# Patient Record
Sex: Female | Born: 1960 | Race: White | Hispanic: No | Smoking: Former smoker
Health system: Southern US, Community
[De-identification: ages and names within clinical notes are randomized; demographics above are authoritative.]

## PROBLEM LIST (undated history)

## (undated) DIAGNOSIS — N8110 Cystocele, unspecified: Secondary | ICD-10-CM

## (undated) DIAGNOSIS — Z87410 Personal history of cervical dysplasia: Secondary | ICD-10-CM

## (undated) DIAGNOSIS — Z973 Presence of spectacles and contact lenses: Secondary | ICD-10-CM

---

## 1997-10-01 HISTORY — PX: VAGINAL HYSTERECTOMY: SUR661

## 1998-02-07 ENCOUNTER — Other Ambulatory Visit: Admission: RE | Admit: 1998-02-07 | Discharge: 1998-02-07 | Payer: Self-pay | Admitting: *Deleted

## 1998-03-17 ENCOUNTER — Other Ambulatory Visit: Admission: RE | Admit: 1998-03-17 | Discharge: 1998-03-17 | Payer: Self-pay | Admitting: *Deleted

## 1998-05-30 ENCOUNTER — Inpatient Hospital Stay (HOSPITAL_COMMUNITY): Admission: RE | Admit: 1998-05-30 | Discharge: 1998-06-01 | Payer: Self-pay | Admitting: *Deleted

## 1998-12-06 ENCOUNTER — Other Ambulatory Visit: Admission: RE | Admit: 1998-12-06 | Discharge: 1998-12-06 | Payer: Self-pay | Admitting: *Deleted

## 2000-01-02 ENCOUNTER — Other Ambulatory Visit: Admission: RE | Admit: 2000-01-02 | Discharge: 2000-01-02 | Payer: Self-pay | Admitting: *Deleted

## 2001-01-08 ENCOUNTER — Other Ambulatory Visit: Admission: RE | Admit: 2001-01-08 | Discharge: 2001-01-08 | Payer: Self-pay | Admitting: *Deleted

## 2002-01-15 ENCOUNTER — Other Ambulatory Visit: Admission: RE | Admit: 2002-01-15 | Discharge: 2002-01-15 | Payer: Self-pay | Admitting: *Deleted

## 2003-02-18 ENCOUNTER — Other Ambulatory Visit: Admission: RE | Admit: 2003-02-18 | Discharge: 2003-02-18 | Payer: Self-pay | Admitting: *Deleted

## 2004-04-07 ENCOUNTER — Other Ambulatory Visit: Admission: RE | Admit: 2004-04-07 | Discharge: 2004-04-07 | Payer: Self-pay | Admitting: Obstetrics and Gynecology

## 2005-07-12 ENCOUNTER — Other Ambulatory Visit: Admission: RE | Admit: 2005-07-12 | Discharge: 2005-07-12 | Payer: Self-pay | Admitting: Obstetrics and Gynecology

## 2012-03-27 ENCOUNTER — Other Ambulatory Visit: Payer: Self-pay | Admitting: Family Medicine

## 2012-03-27 DIAGNOSIS — R519 Headache, unspecified: Secondary | ICD-10-CM

## 2012-04-02 ENCOUNTER — Ambulatory Visit
Admission: RE | Admit: 2012-04-02 | Discharge: 2012-04-02 | Disposition: A | Discharge status: Home / Self Care | Payer: 59 | Source: Ambulatory Visit | Attending: Family Medicine | Admitting: Family Medicine

## 2012-04-02 DIAGNOSIS — R519 Headache, unspecified: Secondary | ICD-10-CM

## 2012-04-02 MED ORDER — GADOBENATE DIMEGLUMINE 529 MG/ML IV SOLN
20.0000 mL | Freq: Once | INTRAVENOUS | Status: AC | PRN
  Administered 2012-04-02: 20 mL via INTRAVENOUS

## 2012-04-04 ENCOUNTER — Other Ambulatory Visit: Payer: Self-pay

## 2012-07-29 ENCOUNTER — Encounter (HOSPITAL_COMMUNITY): Payer: Self-pay | Admitting: *Deleted

## 2012-07-29 ENCOUNTER — Emergency Department (HOSPITAL_COMMUNITY)
Admission: EM | Admit: 2012-07-29 | Discharge: 2012-07-30 | Disposition: A | Discharge status: Home / Self Care | Payer: Managed Care, Other (non HMO) | Attending: Emergency Medicine | Admitting: Emergency Medicine

## 2012-07-29 DIAGNOSIS — R0789 Other chest pain: Secondary | ICD-10-CM | POA: Insufficient documentation

## 2012-07-29 LAB — COMPREHENSIVE METABOLIC PANEL
AST: 13 U/L (ref 0–37)
Albumin: 3.9 g/dL (ref 3.5–5.2)
Calcium: 9.5 mg/dL (ref 8.4–10.5)
Creatinine, Ser: 0.7 mg/dL (ref 0.50–1.10)
GFR calc non Af Amer: >90 mL/min (ref >90)
Total Protein: 7.8 g/dL (ref 6.0–8.3)

## 2012-07-29 LAB — CBC WITH DIFFERENTIAL/PLATELET
Basophils Absolute: 0 10*3/uL (ref 0.0–0.1)
Basophils Relative: 0 % (ref 0–1)
Eosinophils Absolute: 0.2 10*3/uL (ref 0.0–0.7)
Eosinophils Relative: 2 % (ref 0–5)
HCT: 36.9 % (ref 36.0–46.0)
MCHC: 32.5 g/dL (ref 30.0–36.0)
MCV: 85.2 fL (ref 78.0–100.0)
Monocytes Absolute: 0.6 10*3/uL (ref 0.1–1.0)
RDW: 13.5 % (ref 11.5–15.5)

## 2012-07-29 LAB — POCT I-STAT TROPONIN I

## 2012-07-29 NOTE — ED Notes (Addendum)
Pt states CP that started Sat. Pt states that it isn't sharp pain but that she does notice it. Pt states that pain starts above left breast and down her left arm. 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 Pt states when she takes a deep breath or when she cough pain is worse

## 2012-07-29 NOTE — ED Notes (Signed)
Pt states she has had chest pain on and off since Saturday. She states the pain radiates into left arm and neck.

## 2012-07-30 ENCOUNTER — Emergency Department (HOSPITAL_COMMUNITY): Payer: Managed Care, Other (non HMO)

## 2012-07-30 LAB — D-DIMER, QUANTITATIVE: D-Dimer, Quant: <0.27 ug/mL-FEU (ref 0.00–0.48)

## 2012-07-30 MED ORDER — NAPROXEN 500 MG PO TABS: 500.0000 mg | ORAL_TABLET | Freq: Two times a day (BID) | ORAL | Status: DC

## 2012-07-30 NOTE — ED Provider Notes (Signed)
History     CSN: [id number]  Arrival date & time 07/29/12  2024   First MD Initiated Contact with Patient 07/29/12 2322      Chief Complaint  Patient presents with  . Chest Pain    (Consider location/radiation/quality/duration/timing/severity/associated sxs/prior treatment) HPI 51 year old female presents to emergency department with complaint of left-sided chest pain. Pain first noticed on Saturday, worse today. Pain worse with moving left arm, and when taking deep breaths. She denies any leg swelling, no estrogens, she is a nonsmoker, no prolonged immobilization. Patient denies history of PE or DVT. She denies any hypertension, diabetes, or history of coronary disease. Mother had pacemaker placed in her 49s, father had mitral valve prolapse. Patient was seen at urgent care and transferred to the emergency department due to abnormal EKG. Patient reports she was lifting small children throughout the day on Saturday. She's never had similar pain. She denies any shortness of breath, diaphoresis, nausea or other associated symptoms  History reviewed. No pertinent past medical history.  History reviewed. No pertinent past surgical history.  History reviewed. No pertinent family history.  History  Substance Use Topics  . Smoking status: Never Smoker   . Smokeless tobacco: Not on file  . Alcohol Use: Yes    OB History    Grav Para Term Preterm Abortions TAB SAB Ect Mult Living                  Review of Systems  All other systems reviewed and are negative.    Allergies  Review of patient's allergies indicates no known allergies.  Home Medications   Current Outpatient Rx  Name Route Sig Dispense Refill  . VITAMIN D PO Oral Take 1 tablet by mouth daily.    . IBUPROFEN 200 MG PO TABS Oral Take 200 mg by mouth every 6 (six) hours as needed. For pain    . PROBIOTIC DAILY PO Oral Take 1 tablet by mouth daily.    . SUDAFED PO Oral Take 1 tablet by mouth every 6 (six) hours as  needed. For allergies    . NAPROXEN 500 MG PO TABS Oral Take 1 tablet (500 mg total) by mouth 2 (two) times daily. 30 tablet 0    BP 123/79  Pulse 74  Temp 98.1 F (36.7 C) (Oral)  Resp 16  SpO2 99%  Physical Exam  Nursing note and vitals reviewed. Constitutional: She is oriented to person, place, and time. She appears well-developed and well-nourished.  HENT:  Head: Normocephalic and atraumatic.  Left Ear: External ear normal.  Nose: Nose normal.  Mouth/Throat: Oropharynx is clear and moist.  Eyes: Conjunctivae normal and EOM are normal. Pupils are equal, round, and reactive to light.  Neck: Normal range of motion. Neck supple. No JVD present. No tracheal deviation present. No thyromegaly present.  Cardiovascular: Normal rate, regular rhythm, normal heart sounds and intact distal pulses.  Exam reveals no gallop and no friction rub.   No murmur heard. Pulmonary/Chest: Effort normal and breath sounds normal. No stridor. No respiratory distress. She has no wheezes. She has no rales. She exhibits tenderness (Patient with tenderness to left upper chest with palpation).  Abdominal: Soft. Bowel sounds are normal. She exhibits no distension and no mass. There is no tenderness. There is no rebound and no guarding.  Musculoskeletal: Normal range of motion. She exhibits no edema and no tenderness.  Lymphadenopathy:    She has no cervical adenopathy.  Neurological: She is alert and oriented to person,  place, and time. She exhibits normal muscle tone. Coordination normal.  Skin: Skin is warm and dry. No rash noted. No erythema. No pallor.  Psychiatric: She has a normal mood and affect. Her behavior is normal. Judgment and thought content normal.    ED Course  Procedures (including critical care time)  Labs Reviewed  COMPREHENSIVE METABOLIC PANEL - Abnormal; Notable for the following:    Total Bilirubin 0.2 (*)     All other components within normal limits  CBC WITH DIFFERENTIAL  POCT  I-STAT TROPONIN I  D-DIMER, QUANTITATIVE  TROPONIN I  LAB REPORT - SCANNED   Dg Chest 2 View  07/30/2012  *RADIOLOGY REPORT*  Clinical Data: Left side chest pain.  CHEST - 2 VIEW  Comparison: None.  Findings: Lungs clear.  Heart size normal.  No pneumothorax or pleural fluid.  IMPRESSION: Negative chest.   Original Report Authenticated By: Bernadene Bell. Maricela Curet, M.D.     Date: 07/29/2012  Rate: 67  Rhythm: normal sinus rhythm  QRS Axis: normal  Intervals: normal  ST/T Wave abnormalities: normal  Conduction Disutrbances:none  Narrative Interpretation:   Old EKG Reviewed: none available    1. Atypical chest pain       MDM  51 year old female with left-sided chest pain. EKG from urgent care shows normal sinus rhythm, no ST elevation. There was question of nonspecific T wave abnormalities with increased T waves. Patient was given nitroglycerin and aspirin. EKG here without any T wave abnormalities, no ST elevation. Lab work unremarkable. I do not feel this is a CNS, PE, pneumothorax, pneumonia, or other serious condition. Suspect musculoskeletal pain. Will treat with NSAIDs and follow up with her primary care Dr.        Olivia Mackie, MD 07/30/12 [id number]

## 2016-02-29 DIAGNOSIS — J029 Acute pharyngitis, unspecified: Secondary | ICD-10-CM | POA: Diagnosis not present

## 2016-03-06 DIAGNOSIS — Z1231 Encounter for screening mammogram for malignant neoplasm of breast: Secondary | ICD-10-CM | POA: Diagnosis not present

## 2016-03-06 DIAGNOSIS — Z01419 Encounter for gynecological examination (general) (routine) without abnormal findings: Secondary | ICD-10-CM | POA: Diagnosis not present

## 2016-03-06 DIAGNOSIS — Z1239 Encounter for other screening for malignant neoplasm of breast: Secondary | ICD-10-CM | POA: Diagnosis not present

## 2016-03-06 DIAGNOSIS — Z6824 Body mass index (BMI) 24.0-24.9, adult: Secondary | ICD-10-CM | POA: Diagnosis not present

## 2016-03-14 DIAGNOSIS — Z13820 Encounter for screening for osteoporosis: Secondary | ICD-10-CM | POA: Diagnosis not present

## 2016-12-03 DIAGNOSIS — M170 Bilateral primary osteoarthritis of knee: Secondary | ICD-10-CM | POA: Diagnosis not present

## 2017-05-09 DIAGNOSIS — Z6824 Body mass index (BMI) 24.0-24.9, adult: Secondary | ICD-10-CM | POA: Diagnosis not present

## 2017-05-09 DIAGNOSIS — Z1231 Encounter for screening mammogram for malignant neoplasm of breast: Secondary | ICD-10-CM | POA: Diagnosis not present

## 2017-05-09 DIAGNOSIS — Z01419 Encounter for gynecological examination (general) (routine) without abnormal findings: Secondary | ICD-10-CM | POA: Diagnosis not present

## 2017-05-21 DIAGNOSIS — Z13220 Encounter for screening for lipoid disorders: Secondary | ICD-10-CM | POA: Diagnosis not present

## 2017-05-21 DIAGNOSIS — Z23 Encounter for immunization: Secondary | ICD-10-CM | POA: Diagnosis not present

## 2017-05-21 DIAGNOSIS — E559 Vitamin D deficiency, unspecified: Secondary | ICD-10-CM | POA: Diagnosis not present

## 2017-05-21 DIAGNOSIS — Z0000 Encounter for general adult medical examination without abnormal findings: Secondary | ICD-10-CM | POA: Diagnosis not present

## 2017-08-26 ENCOUNTER — Other Ambulatory Visit: Payer: Self-pay | Admitting: Obstetrics and Gynecology

## 2017-08-26 DIAGNOSIS — N63 Unspecified lump in unspecified breast: Secondary | ICD-10-CM

## 2017-08-26 DIAGNOSIS — N6459 Other signs and symptoms in breast: Secondary | ICD-10-CM | POA: Diagnosis not present

## 2017-08-26 DIAGNOSIS — N644 Mastodynia: Secondary | ICD-10-CM

## 2017-08-29 ENCOUNTER — Ambulatory Visit
Admission: RE | Admit: 2017-08-29 | Discharge: 2017-08-29 | Disposition: A | Discharge status: Home / Self Care | Payer: Managed Care, Other (non HMO) | Source: Ambulatory Visit | Attending: Obstetrics and Gynecology | Admitting: Obstetrics and Gynecology

## 2017-08-29 ENCOUNTER — Other Ambulatory Visit: Payer: Self-pay | Admitting: Obstetrics and Gynecology

## 2017-08-29 DIAGNOSIS — N63 Unspecified lump in unspecified breast: Secondary | ICD-10-CM

## 2017-08-29 DIAGNOSIS — N6489 Other specified disorders of breast: Secondary | ICD-10-CM | POA: Diagnosis not present

## 2017-08-29 DIAGNOSIS — N644 Mastodynia: Secondary | ICD-10-CM

## 2017-08-29 DIAGNOSIS — R922 Inconclusive mammogram: Secondary | ICD-10-CM | POA: Diagnosis not present

## 2017-11-08 DIAGNOSIS — J069 Acute upper respiratory infection, unspecified: Secondary | ICD-10-CM | POA: Diagnosis not present

## 2017-11-08 DIAGNOSIS — R05 Cough: Secondary | ICD-10-CM | POA: Diagnosis not present

## 2017-11-27 ENCOUNTER — Ambulatory Visit
Admission: RE | Admit: 2017-11-27 | Discharge: 2017-11-27 | Disposition: A | Discharge status: Home / Self Care | Payer: BLUE CROSS/BLUE SHIELD | Source: Ambulatory Visit | Attending: Obstetrics and Gynecology | Admitting: Obstetrics and Gynecology

## 2017-11-27 DIAGNOSIS — N6489 Other specified disorders of breast: Secondary | ICD-10-CM | POA: Diagnosis not present

## 2017-11-27 DIAGNOSIS — N63 Unspecified lump in unspecified breast: Secondary | ICD-10-CM

## 2017-11-27 DIAGNOSIS — N644 Mastodynia: Secondary | ICD-10-CM

## 2018-06-18 DIAGNOSIS — Z6824 Body mass index (BMI) 24.0-24.9, adult: Secondary | ICD-10-CM | POA: Diagnosis not present

## 2018-06-18 DIAGNOSIS — Z01419 Encounter for gynecological examination (general) (routine) without abnormal findings: Secondary | ICD-10-CM | POA: Diagnosis not present

## 2018-06-18 DIAGNOSIS — Z1231 Encounter for screening mammogram for malignant neoplasm of breast: Secondary | ICD-10-CM | POA: Diagnosis not present

## 2018-06-18 DIAGNOSIS — Z13820 Encounter for screening for osteoporosis: Secondary | ICD-10-CM | POA: Diagnosis not present

## 2018-06-25 DIAGNOSIS — Z808 Family history of malignant neoplasm of other organs or systems: Secondary | ICD-10-CM | POA: Diagnosis not present

## 2018-06-25 DIAGNOSIS — Z803 Family history of malignant neoplasm of breast: Secondary | ICD-10-CM | POA: Diagnosis not present

## 2018-06-25 DIAGNOSIS — Z800 Family history of malignant neoplasm of digestive organs: Secondary | ICD-10-CM | POA: Diagnosis not present

## 2018-06-25 DIAGNOSIS — Z801 Family history of malignant neoplasm of trachea, bronchus and lung: Secondary | ICD-10-CM | POA: Diagnosis not present

## 2018-08-05 DIAGNOSIS — Z809 Family history of malignant neoplasm, unspecified: Secondary | ICD-10-CM | POA: Diagnosis not present

## 2018-08-06 ENCOUNTER — Other Ambulatory Visit: Payer: Self-pay | Admitting: Obstetrics and Gynecology

## 2018-08-06 DIAGNOSIS — Z803 Family history of malignant neoplasm of breast: Secondary | ICD-10-CM

## 2019-02-24 DIAGNOSIS — N8110 Cystocele, unspecified: Secondary | ICD-10-CM | POA: Diagnosis not present

## 2019-04-08 NOTE — H&P (Signed)
NAME: Angel Pace, Hirt. MEDICAL RECORD [id number] ACCOUNT [id number] DATE OF BIRTH:1960-11-08 FACILITY: [facility] LOCATION:  PHYSICIAN:Cortana Vanderford Garry Heater, MD  HISTORY AND PHYSICAL  DATE OF ADMISSION:  05/07/2019  CHIEF COMPLAINT:  Symptomatic cystocele and rectocele.  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  More recently, she has noticed a vaginal bulging sensation, has not had any symptoms related to ____  and on exam, a moderate cystocele  and small rectocele with good cuff support was noted.  She presents now for anterior and posterior colporrhaphy.  This procedure including the specific risks regarding bleeding, infection, adjacent organ injury, the expected recovery time, the fact that  pelvic organ prolapse can recur were all reviewed with her, which she understands and accepts.  ALLERGIES:  None.  MEDICATIONS:  Valtrex as needed.  FAMILY HISTORY:  Significant for a father with diabetes.  Mother had breast cancer and osteoporosis.  Her father also had a malignant liver tumor.  SOCIAL HISTORY:  She is widowed.  Denies tobacco or drug use.  Moderate alcohol use.  Her PCP is Dr. Harlan Stains.  Also, of note, she had genetic screening in our office last year that was negative.  PHYSICAL EXAMINATION:   VITAL SIGNS: BP 100/78.  Temperature 98.8, weight is 143. HEENT:  Unremarkable. NECK:  Supple, without masses. LUNGS:  Clear. CARDIOVASCULAR:  Regular rate and rhythm without murmurs, rubs or gallops. BREASTS:  Without masses or tenderness. ABDOMEN:  Soft, flat, nontender. PELVIC:  Vulva unremarkable.  On straining, there is a moderate cystocele and small rectocele.  The cuff support looked reasonably normal.  Bimanual otherwise negative.  IMPRESSION:  Symptomatic cystocele with small rectocele.  PLAN:  Anterior and posterior repair.  Procedure and risks discussed as above.  AN/NUANCE  D:04/08/2019  T:04/08/2019 JOB:[id number]

## 2019-05-04 ENCOUNTER — Encounter (HOSPITAL_BASED_OUTPATIENT_CLINIC_OR_DEPARTMENT_OTHER): Payer: Self-pay | Admitting: *Deleted

## 2019-05-04 ENCOUNTER — Other Ambulatory Visit (HOSPITAL_COMMUNITY)
Admission: RE | Admit: 2019-05-04 | Discharge: 2019-05-04 | Disposition: A | Discharge status: Home / Self Care | Payer: BC Managed Care – PPO | Source: Ambulatory Visit | Attending: Obstetrics and Gynecology | Admitting: Obstetrics and Gynecology

## 2019-05-04 ENCOUNTER — Other Ambulatory Visit: Payer: Self-pay

## 2019-05-04 DIAGNOSIS — N8111 Cystocele, midline: Secondary | ICD-10-CM | POA: Diagnosis not present

## 2019-05-04 DIAGNOSIS — Z01812 Encounter for preprocedural laboratory examination: Secondary | ICD-10-CM | POA: Insufficient documentation

## 2019-05-04 DIAGNOSIS — Z20828 Contact with and (suspected) exposure to other viral communicable diseases: Secondary | ICD-10-CM | POA: Diagnosis not present

## 2019-05-04 DIAGNOSIS — N816 Rectocele: Secondary | ICD-10-CM | POA: Diagnosis not present

## 2019-05-04 LAB — SARS CORONAVIRUS 2 (TAT 6-24 HRS): SARS Coronavirus 2: NEGATIVE

## 2019-05-04 NOTE — Progress Notes (Signed)
Spoke w/ pt via phone for pre-op interview.  Npo after mn.  Arrive at 0530.  Needs t&s.  Pt had covid test done today.  Reviewed [facility] guidelines and visitor resstriction policy.  Pt dose not have any medication to bring.

## 2019-05-06 NOTE — Anesthesia Preprocedure Evaluation (Addendum)
Anesthesia Evaluation  Patient identified by MRN, date of birth, ID band Patient awake    Reviewed: Allergy & Precautions, NPO status , Patient's Chart, lab work & pertinent test results  Airway Mallampati: III  TM Distance: >3 FB Neck ROM: Full    Dental no notable dental hx. (+) Dental Advisory Given, Teeth Intact,    Pulmonary former smoker,    Pulmonary exam normal breath sounds clear to auscultation       Cardiovascular negative cardio ROS Normal cardiovascular exam Rhythm:Regular Rate:Normal     Neuro/Psych negative neurological ROS  negative psych ROS   GI/Hepatic negative GI ROS, Neg liver ROS,   Endo/Other  negative endocrine ROS  Renal/GU negative Renal ROS     Musculoskeletal negative musculoskeletal ROS (+)   Abdominal   Peds  Hematology negative hematology ROS (+)   Anesthesia Other Findings cystocele, rectocele  Reproductive/Obstetrics                          Anesthesia Physical Anesthesia Plan  ASA: II  Anesthesia Plan: General   Post-op Pain Management:    Induction: Intravenous  PONV Risk Score and Plan: 4 or greater and Midazolam, Dexamethasone, Ondansetron and Treatment may vary due to age or medical condition  Airway Management Planned: LMA and Oral ETT  Additional Equipment:   Intra-op Plan:   Post-operative Plan: Extubation in OR  Informed Consent: I have reviewed the patients History and Physical, chart, labs and discussed the procedure including the risks, benefits and alternatives for the proposed anesthesia with the patient or authorized representative who has indicated his/her understanding and acceptance.     Dental advisory given  Plan Discussed with: CRNA and Anesthesiologist  Anesthesia Plan Comments:      Anesthesia Quick Evaluation

## 2019-05-07 ENCOUNTER — Other Ambulatory Visit: Payer: Self-pay

## 2019-05-07 ENCOUNTER — Encounter (HOSPITAL_BASED_OUTPATIENT_CLINIC_OR_DEPARTMENT_OTHER): Payer: Self-pay | Admitting: *Deleted

## 2019-05-07 ENCOUNTER — Observation Stay (HOSPITAL_BASED_OUTPATIENT_CLINIC_OR_DEPARTMENT_OTHER): Payer: BC Managed Care – PPO | Admitting: Anesthesiology

## 2019-05-07 ENCOUNTER — Encounter (HOSPITAL_BASED_OUTPATIENT_CLINIC_OR_DEPARTMENT_OTHER)
Admission: RE | Disposition: A | Discharge status: Home / Self Care | Payer: Self-pay | Source: Home / Self Care | Attending: Obstetrics and Gynecology

## 2019-05-07 ENCOUNTER — Observation Stay (HOSPITAL_BASED_OUTPATIENT_CLINIC_OR_DEPARTMENT_OTHER)
Admission: RE | Admit: 2019-05-07 | Discharge: 2019-05-08 | Disposition: A | Discharge status: Home / Self Care | Payer: BC Managed Care – PPO | Attending: Obstetrics and Gynecology | Admitting: Obstetrics and Gynecology

## 2019-05-07 DIAGNOSIS — Z87891 Personal history of nicotine dependence: Secondary | ICD-10-CM | POA: Diagnosis not present

## 2019-05-07 DIAGNOSIS — N816 Rectocele: Secondary | ICD-10-CM | POA: Diagnosis not present

## 2019-05-07 DIAGNOSIS — N8110 Cystocele, unspecified: Principal | ICD-10-CM | POA: Diagnosis present

## 2019-05-07 HISTORY — DX: Personal history of cervical dysplasia: Z87.410

## 2019-05-07 HISTORY — DX: Cystocele, unspecified: N81.10

## 2019-05-07 HISTORY — PX: ANTERIOR AND POSTERIOR REPAIR: SHX5121

## 2019-05-07 HISTORY — DX: Presence of spectacles and contact lenses: Z97.3

## 2019-05-07 LAB — TYPE AND SCREEN
ABO/RH(D): O POS
Antibody Screen: NEGATIVE

## 2019-05-07 LAB — ABO/RH: ABO/RH(D): O POS

## 2019-05-07 SURGERY — ANTERIOR (CYSTOCELE) AND POSTERIOR REPAIR (RECTOCELE)
Anesthesia: General | Site: Perineum

## 2019-05-07 MED ORDER — ACETAMINOPHEN 500 MG PO TABS
1000.0000 mg | ORAL_TABLET | Freq: Once | ORAL | Status: AC
  Administered 2019-05-07: 1000 mg via ORAL
  Filled 2019-05-07: qty 2

## 2019-05-07 MED ORDER — MORPHINE SULFATE 2 MG/ML IV SOLN
INTRAVENOUS | Status: DC
  Filled 2019-05-07: qty 30

## 2019-05-07 MED ORDER — MIDAZOLAM HCL 2 MG/2ML IJ SOLN
INTRAMUSCULAR | Status: AC
  Filled 2019-05-07: qty 2

## 2019-05-07 MED ORDER — PROMETHAZINE HCL 25 MG/ML IJ SOLN
6.2500 mg | INTRAMUSCULAR | Status: DC | PRN
  Filled 2019-05-07: qty 1

## 2019-05-07 MED ORDER — LIDOCAINE-EPINEPHRINE 1 %-1:100000 IJ SOLN
INTRAMUSCULAR | Status: DC | PRN
  Administered 2019-05-07: 20 mL

## 2019-05-07 MED ORDER — GABAPENTIN 300 MG PO CAPS
ORAL_CAPSULE | ORAL | Status: AC
  Filled 2019-05-07: qty 1

## 2019-05-07 MED ORDER — DIPHENHYDRAMINE HCL 50 MG/ML IJ SOLN
12.5000 mg | Freq: Four times a day (QID) | INTRAMUSCULAR | Status: DC | PRN
  Filled 2019-05-07: qty 0.25

## 2019-05-07 MED ORDER — PROPOFOL 10 MG/ML IV BOLUS
INTRAVENOUS | Status: DC | PRN
  Administered 2019-05-07: 120 mg via INTRAVENOUS

## 2019-05-07 MED ORDER — DEXTROSE IN LACTATED RINGERS 5 % IV SOLN
INTRAVENOUS | Status: DC
  Filled 2019-05-07: qty 1000

## 2019-05-07 MED ORDER — OXYCODONE-ACETAMINOPHEN 5-325 MG PO TABS
ORAL_TABLET | ORAL | Status: AC
  Filled 2019-05-07: qty 1

## 2019-05-07 MED ORDER — SODIUM CHLORIDE 0.9 % IV SOLN
2.0000 g | INTRAVENOUS | Status: AC
  Administered 2019-05-07 (×2): 2 g via INTRAVENOUS
  Filled 2019-05-07: qty 2

## 2019-05-07 MED ORDER — PROPOFOL 10 MG/ML IV BOLUS
INTRAVENOUS | Status: AC
  Filled 2019-05-07: qty 40

## 2019-05-07 MED ORDER — KETOROLAC TROMETHAMINE 30 MG/ML IJ SOLN
INTRAMUSCULAR | Status: AC
  Filled 2019-05-07: qty 1

## 2019-05-07 MED ORDER — SODIUM CHLORIDE 0.9 % IV SOLN
INTRAVENOUS | Status: AC
  Filled 2019-05-07: qty 2

## 2019-05-07 MED ORDER — GABAPENTIN 300 MG PO CAPS
300.0000 mg | ORAL_CAPSULE | Freq: Two times a day (BID) | ORAL | Status: DC
  Administered 2019-05-07 (×2): 300 mg via ORAL
  Filled 2019-05-07: qty 1

## 2019-05-07 MED ORDER — KETOROLAC TROMETHAMINE 30 MG/ML IJ SOLN
30.0000 mg | Freq: Four times a day (QID) | INTRAMUSCULAR | Status: DC
  Administered 2019-05-07 – 2019-05-08 (×3): 30 mg via INTRAVENOUS
  Filled 2019-05-07: qty 1

## 2019-05-07 MED ORDER — HYDROMORPHONE HCL 1 MG/ML IJ SOLN
0.2500 mg | INTRAMUSCULAR | Status: DC | PRN
  Filled 2019-05-07: qty 0.5

## 2019-05-07 MED ORDER — OXYCODONE-ACETAMINOPHEN 5-325 MG PO TABS
1.0000 | ORAL_TABLET | ORAL | Status: DC | PRN
  Administered 2019-05-07: 1 via ORAL
  Filled 2019-05-07: qty 2

## 2019-05-07 MED ORDER — FENTANYL CITRATE (PF) 100 MCG/2ML IJ SOLN
INTRAMUSCULAR | Status: AC
  Filled 2019-05-07: qty 4

## 2019-05-07 MED ORDER — EPHEDRINE 5 MG/ML INJ
INTRAVENOUS | Status: AC
  Filled 2019-05-07: qty 10

## 2019-05-07 MED ORDER — OXYCODONE HCL 5 MG PO TABS
5.0000 mg | ORAL_TABLET | Freq: Once | ORAL | Status: DC | PRN
  Filled 2019-05-07: qty 1

## 2019-05-07 MED ORDER — ONDANSETRON HCL 4 MG/2ML IJ SOLN
4.0000 mg | Freq: Four times a day (QID) | INTRAMUSCULAR | Status: DC | PRN
  Filled 2019-05-07: qty 2

## 2019-05-07 MED ORDER — ARTIFICIAL TEARS OPHTHALMIC OINT
TOPICAL_OINTMENT | OPHTHALMIC | Status: AC
  Filled 2019-05-07: qty 3.5

## 2019-05-07 MED ORDER — FAMOTIDINE 20 MG PO TABS
20.0000 mg | ORAL_TABLET | Freq: Once | ORAL | Status: AC
  Administered 2019-05-07: 06:00:00 20 mg via ORAL
  Filled 2019-05-07: qty 1

## 2019-05-07 MED ORDER — LACTATED RINGERS IV SOLN
INTRAVENOUS | Status: DC
  Administered 2019-05-07 (×2): via INTRAVENOUS
  Filled 2019-05-07 (×2): qty 1000

## 2019-05-07 MED ORDER — IBUPROFEN 800 MG PO TABS
800.0000 mg | ORAL_TABLET | Freq: Four times a day (QID) | ORAL | Status: DC
  Filled 2019-05-07: qty 1

## 2019-05-07 MED ORDER — ONDANSETRON HCL 4 MG/2ML IJ SOLN
INTRAMUSCULAR | Status: AC
  Filled 2019-05-07: qty 2

## 2019-05-07 MED ORDER — FENTANYL CITRATE (PF) 100 MCG/2ML IJ SOLN
INTRAMUSCULAR | Status: DC | PRN
  Administered 2019-05-07: 25 ug via INTRAVENOUS

## 2019-05-07 MED ORDER — DEXAMETHASONE SODIUM PHOSPHATE 10 MG/ML IJ SOLN
INTRAMUSCULAR | Status: DC | PRN
  Administered 2019-05-07: 5 mg via INTRAVENOUS

## 2019-05-07 MED ORDER — ESTRADIOL 0.1 MG/GM VA CREA
TOPICAL_CREAM | VAGINAL | Status: AC
  Filled 2019-05-07: qty 42.5

## 2019-05-07 MED ORDER — LIDOCAINE 2% (20 MG/ML) 5 ML SYRINGE
INTRAMUSCULAR | Status: AC
  Filled 2019-05-07: qty 5

## 2019-05-07 MED ORDER — OXYCODONE HCL 5 MG/5ML PO SOLN
5.0000 mg | Freq: Once | ORAL | Status: DC | PRN
  Filled 2019-05-07: qty 5

## 2019-05-07 MED ORDER — DIPHENHYDRAMINE HCL 12.5 MG/5ML PO ELIX
12.5000 mg | ORAL_SOLUTION | Freq: Four times a day (QID) | ORAL | Status: DC | PRN
  Filled 2019-05-07: qty 5

## 2019-05-07 MED ORDER — NALOXONE HCL 0.4 MG/ML IJ SOLN
0.4000 mg | INTRAMUSCULAR | Status: DC | PRN
  Filled 2019-05-07: qty 1

## 2019-05-07 MED ORDER — LIDOCAINE 2% (20 MG/ML) 5 ML SYRINGE
INTRAMUSCULAR | Status: DC | PRN
  Administered 2019-05-07: 60 mg via INTRAVENOUS

## 2019-05-07 MED ORDER — MENTHOL 3 MG MT LOZG
1.0000 | LOZENGE | OROMUCOSAL | Status: DC | PRN
  Filled 2019-05-07: qty 9

## 2019-05-07 MED ORDER — ONDANSETRON HCL 4 MG PO TABS
4.0000 mg | ORAL_TABLET | Freq: Four times a day (QID) | ORAL | Status: DC | PRN
  Filled 2019-05-07: qty 1

## 2019-05-07 MED ORDER — ONDANSETRON HCL 4 MG/2ML IJ SOLN
INTRAMUSCULAR | Status: DC | PRN
  Administered 2019-05-07: 4 mg via INTRAVENOUS

## 2019-05-07 MED ORDER — MIDAZOLAM HCL 2 MG/2ML IJ SOLN
INTRAMUSCULAR | Status: DC | PRN
  Administered 2019-05-07: 2 mg via INTRAVENOUS

## 2019-05-07 MED ORDER — FAMOTIDINE 20 MG PO TABS
ORAL_TABLET | ORAL | Status: AC
  Filled 2019-05-07: qty 1

## 2019-05-07 MED ORDER — SODIUM CHLORIDE 0.9% FLUSH
9.0000 mL | INTRAVENOUS | Status: DC | PRN
  Filled 2019-05-07: qty 10

## 2019-05-07 MED ORDER — ACETAMINOPHEN 500 MG PO TABS
ORAL_TABLET | ORAL | Status: AC
  Filled 2019-05-07: qty 2

## 2019-05-07 MED ORDER — ESTRADIOL 0.1 MG/GM VA CREA
TOPICAL_CREAM | VAGINAL | Status: DC | PRN
  Administered 2019-05-07: 1 via VAGINAL

## 2019-05-07 MED ORDER — KETOROLAC TROMETHAMINE 30 MG/ML IJ SOLN
30.0000 mg | Freq: Once | INTRAMUSCULAR | Status: AC | PRN
  Filled 2019-05-07: qty 1

## 2019-05-07 MED ORDER — SODIUM CHLORIDE (PF) 0.9 % IJ SOLN
INTRAMUSCULAR | Status: DC | PRN
  Administered 2019-05-07: 20 mL

## 2019-05-07 MED ORDER — KETOROLAC TROMETHAMINE 30 MG/ML IJ SOLN
INTRAMUSCULAR | Status: DC | PRN
  Administered 2019-05-07: 30 mg via INTRAVENOUS

## 2019-05-07 MED ORDER — KETOROLAC TROMETHAMINE 30 MG/ML IJ SOLN
30.0000 mg | Freq: Once | INTRAMUSCULAR | Status: DC
  Filled 2019-05-07: qty 1

## 2019-05-07 MED ORDER — INDIGOTINDISULFONATE SODIUM 8 MG/ML IJ SOLN
INTRAMUSCULAR | Status: AC
  Filled 2019-05-07: qty 5

## 2019-05-07 MED ORDER — EPHEDRINE SULFATE 50 MG/ML IJ SOLN
INTRAMUSCULAR | Status: DC | PRN
  Administered 2019-05-07: 10 mg via INTRAVENOUS

## 2019-05-07 SURGICAL SUPPLY — 29 items
CANISTER SUCT 3000ML PPV (MISCELLANEOUS) ×2 IMPLANT
CATH ROBINSON RED A/P 14FR (CATHETERS) ×2 IMPLANT
COVER WAND RF STERILE (DRAPES) ×2 IMPLANT
DECANTER SPIKE VIAL GLASS SM (MISCELLANEOUS) ×2 IMPLANT
GAUZE PACKING 1 X5 YD ST (GAUZE/BANDAGES/DRESSINGS) ×2 IMPLANT
GLOVE BIO SURGEON STRL SZ 6.5 (GLOVE) ×2 IMPLANT
GLOVE BIO SURGEON STRL SZ7 (GLOVE) ×2 IMPLANT
GLOVE BIOGEL PI IND STRL 7.0 (GLOVE) ×1 IMPLANT
GLOVE BIOGEL PI INDICATOR 7.0 (GLOVE) ×1
GOWN STRL REUS W/TWL LRG LVL3 (GOWN DISPOSABLE) ×8 IMPLANT
HOLDER FOLEY CATH W/STRAP (MISCELLANEOUS) ×2 IMPLANT
NEEDLE HYPO 22GX1.5 SAFETY (NEEDLE) ×2 IMPLANT
NEEDLE SPNL 22GX3.5 QUINCKE BK (NEEDLE) ×2 IMPLANT
NS IRRIG 500ML POUR BTL (IV SOLUTION) ×2 IMPLANT
PACK VAGINAL WOMENS (CUSTOM PROCEDURE TRAY) ×2 IMPLANT
SET IRRIG Y TYPE TUR BLADDER L (SET/KITS/TRAYS/PACK) IMPLANT
SUT CHROMIC 2 0 CT 1 (SUTURE) IMPLANT
SUT SILK 2 0 FSL 18 (SUTURE) IMPLANT
SUT VIC AB 2-0 CT1 (SUTURE) ×4 IMPLANT
SUT VIC AB 2-0 CT1 27 (SUTURE)
SUT VIC AB 2-0 CT1 TAPERPNT 27 (SUTURE) IMPLANT
SUT VIC AB 2-0 SH 27 (SUTURE) ×5
SUT VIC AB 2-0 SH 27XBRD (SUTURE) ×5 IMPLANT
SUT VIC AB 2-0 UR6 27 (SUTURE) ×4 IMPLANT
SUT VICRYL RAPIDE 3 0 (SUTURE) ×6 IMPLANT
SUT VICRYL RAPIDE 3-0 36IN (SUTURE) IMPLANT
TOWEL OR 17X26 10 PK STRL BLUE (TOWEL DISPOSABLE) ×2 IMPLANT
TRAY FOLEY W/BAG SLVR 14FR (SET/KITS/TRAYS/PACK) ×2 IMPLANT
WATER STERILE IRR 500ML POUR (IV SOLUTION) IMPLANT

## 2019-05-07 NOTE — Op Note (Signed)
Preoperative diagnosis: Symptomatic cystocele and rectocele  Postoperative diagnosis: Same  Procedure: Anterior and posterior colporrhaphy  Surgeon: Matthew Saras  Assistant: [place]  EBL: 20 cc  Procedure and findings:  The patient was taken to the operating room after an adequate level of general anesthesia was obtained with the legs in stirrups the perineum and vagina were prepped and draped in the bladder was drained appropriate timeouts taken at that point.  EUA revealed moderate cystocele small rectocele bimanual otherwise negative her cuff support was reasonably normal.  Starting with the anterior repair, diluted Xylocaine was injected into the midline of the anterior vaginal wall, starting at the cuff the midline incision was made up to the UV angle Allis clamps were then used to place the vaginal mucosa on traction and the underlying perivesical tissue was dissected with sharp and blunt dissection reducing the cystocele.  When this was completed, 2-0 Vicryl sutures were then used to plicate the perivesical fascia in the midline for support.  Excess vaginal mucosa was trimmed and then approximated with 2-0 Vicryl interrupted sutures.  On the posterior side, a small triangle of perineal skin was excised, the posterior vaginal mucosa was incised in the midline approximately two thirds of the way up, the underlying perirectal tissue and rectocele were reduced, once this was completed 2-0 Vicryl sutures were then used to reapproximate the perirectal fascia in the midline.  Very small amount of excess posterior mucosa was excised and then reapproximated with 2-0 Vicryl interrupted sutures.  3-0 Vicryl repeat sutures were used for the deeper perineal tissue and perineal skin.  Foley catheter then positioned draining clear urine.  Vagina was packed with Estrace pack she tolerated this well went to recovery room in good condition.  Dictated with Dragon Medical 1  Margarette Asal MD

## 2019-05-07 NOTE — Anesthesia Procedure Notes (Addendum)
Procedure Name: LMA Insertion Performed by: Wanita Chamberlain, CRNA Patient Re-evaluated:Patient Re-evaluated prior to induction Oxygen Delivery Method: Circle system utilized Preoxygenation: Pre-oxygenation with 100% oxygen Induction Type: IV induction Ventilation: Mask ventilation without difficulty LMA: LMA inserted LMA Size: 4.0 Number of attempts: 1 Placement Confirmation: breath sounds checked- equal and bilateral,  CO2 detector and positive ETCO2 Tube secured with: Tape Dental Injury: Teeth and Oropharynx as per pre-operative assessment

## 2019-05-07 NOTE — Progress Notes (Signed)
Vaginal packing removed, pt tolerated well.

## 2019-05-07 NOTE — Transfer of Care (Signed)
Immediate Anesthesia Transfer of Care Note  Patient: Angel Pace  Procedure(s) Performed: ANTERIOR (CYSTOCELE) AND POSTERIOR REPAIR (RECTOCELE) (N/A Perineum)  Patient Location: PACU  Anesthesia Type:General  Level of Consciousness: awake, alert , oriented and patient cooperative  Airway & Oxygen Therapy: Patient Spontanous Breathing and Patient connected to nasal cannula oxygen  Post-op Assessment: Report given to RN and Post -op Vital signs reviewed and stable  Post vital signs: Reviewed and stable  Last Vitals:  Vitals Value Taken Time  BP    Temp    Pulse    Resp    SpO2      Last Pain:  Vitals:   05/07/19 0632  TempSrc:   PainSc: 0-No pain      Patients Stated Pain Goal: 7 (13/08/65 7846)  Complications: No apparent anesthesia complications

## 2019-05-07 NOTE — Anesthesia Postprocedure Evaluation (Signed)
Anesthesia Post Note  Patient: Angel Pace  Procedure(s) Performed: ANTERIOR (CYSTOCELE) AND POSTERIOR REPAIR (RECTOCELE) (N/A Perineum)     Patient location during evaluation: PACU Anesthesia Type: General Level of consciousness: awake and alert Pain management: pain level controlled Vital Signs Assessment: post-procedure vital signs reviewed and stable Respiratory status: spontaneous breathing, nonlabored ventilation, respiratory function stable and patient connected to nasal cannula oxygen Cardiovascular status: blood pressure returned to baseline and stable Postop Assessment: no apparent nausea or vomiting Anesthetic complications: no    Last Vitals:  Vitals:   05/07/19 1022 05/07/19 1130  BP: (!) 145/86 106/66  Pulse: 64 65  Resp: 16 16  Temp: 36.8 C 36.5 C  SpO2: 100% 98%    Last Pain:  Vitals:   05/07/19 1130  TempSrc:   PainSc: Asleep                 Ryan P Ellender

## 2019-05-07 NOTE — Progress Notes (Signed)
The patient was re-examined with no change in status 

## 2019-05-08 ENCOUNTER — Encounter (HOSPITAL_BASED_OUTPATIENT_CLINIC_OR_DEPARTMENT_OTHER): Payer: Self-pay | Admitting: Obstetrics and Gynecology

## 2019-05-08 DIAGNOSIS — N8110 Cystocele, unspecified: Secondary | ICD-10-CM | POA: Diagnosis not present

## 2019-05-08 DIAGNOSIS — Z87891 Personal history of nicotine dependence: Secondary | ICD-10-CM | POA: Diagnosis not present

## 2019-05-08 DIAGNOSIS — N816 Rectocele: Secondary | ICD-10-CM | POA: Diagnosis not present

## 2019-05-08 LAB — CBC
HCT: 31.4 % — ABNORMAL LOW (ref 36.0–46.0)
Hemoglobin: 10.3 g/dL — ABNORMAL LOW (ref 12.0–15.0)
MCH: 30 pg (ref 26.0–34.0)
MCHC: 32.8 g/dL (ref 30.0–36.0)
MCV: 91.5 fL (ref 80.0–100.0)
Platelets: 257 10*3/uL (ref 150–400)
RBC: 3.43 MIL/uL — ABNORMAL LOW (ref 3.87–5.11)
RDW: 13.2 % (ref 11.5–15.5)
WBC: 11.9 10*3/uL — ABNORMAL HIGH (ref 4.0–10.5)
nRBC: 0 % (ref 0.0–0.2)

## 2019-05-08 MED ORDER — OXYCODONE-ACETAMINOPHEN 5-325 MG PO TABS: 1.0000 | ORAL_TABLET | ORAL | 0 refills | Status: AC | PRN

## 2019-05-08 MED ORDER — IBUPROFEN 800 MG PO TABS: 800.0000 mg | ORAL_TABLET | Freq: Three times a day (TID) | ORAL | 1 refills | Status: AC | PRN

## 2019-05-08 MED ORDER — KETOROLAC TROMETHAMINE 30 MG/ML IJ SOLN
INTRAMUSCULAR | Status: AC
  Filled 2019-05-08: qty 1

## 2019-05-08 NOTE — Discharge Instructions (Signed)
Warm H2O sitz bath as needed BID for comfort, can follow with Dermoplast spray as needed Colace daily Miralax as needed

## 2019-05-08 NOTE — Discharge Summary (Signed)
Physician Discharge Summary  Patient ID: Angel Pace MRN: [id number] DOB/AGE: 11-29-1960 58 y.o.  Admit date: 05/07/2019 Discharge date: 05/08/2019  Admission Diagnoses:Cystocele/rectocele  Discharge Diagnoses: same Active Problems:   Cystocele with rectocele   Discharged Condition: good  Hospital Course: adm for A and P repair, on POD 1>>vag pack removed and cath out, able to void w/o prob, afeb and tol PO  Consults: None  Significant Diagnostic Studies: labs:  Results for orders placed or performed during the hospital encounter of 05/07/19 (from the past 24 hour(s))  CBC     Status: Abnormal   Collection Time: 05/08/19  5:05 AM  Result Value Ref Range   WBC 11.9 (H) 4.0 - 10.5 K/uL   RBC 3.43 (L) 3.87 - 5.11 MIL/uL   Hemoglobin 10.3 (L) 12.0 - 15.0 g/dL   HCT 31.4 (L) 36.0 - 46.0 %   MCV 91.5 80.0 - 100.0 fL   MCH 30.0 26.0 - 34.0 pg   MCHC 32.8 30.0 - 36.0 g/dL   RDW 13.2 11.5 - 15.5 %   Platelets 257 150 - 400 K/uL   nRBC 0.0 0.0 - 0.2 %    Treatments: surgery: A and P repair  Discharge Exam: Blood pressure (!) 109/56, pulse 78, temperature 98.3 F (36.8 C), resp. rate 18, height 5\' 6"  (1.676 m), weight 64.9 kg, SpO2 98 %. General appearance: alert abd soft, flat + BS  Disposition: Discharge disposition: 01-Home or Self Care        Allergies as of 05/08/2019   No Known Allergies     Medication List    TAKE these medications   ibuprofen 800 MG tablet Commonly known as: ADVIL Take 1 tablet (800 mg total) by mouth every 8 (eight) hours as needed.   oxyCODONE-acetaminophen 5-325 MG tablet Commonly known as: PERCOCET/ROXICET Take 1 tablet by mouth every 4 (four) hours as needed for moderate pain.   valACYclovir 500 MG tablet Commonly known as: VALTREX Take 500 mg by mouth as needed.      Follow-up Information    Molli Posey, MD. Schedule an appointment as soon as possible for a visit in 1 week(s).   Specialty: Obstetrics and  Gynecology Contact information: [address] [place] [address] [phone]           Signed: Margarette Asal 05/08/2019, 7:06 AM

## 2019-07-01 DIAGNOSIS — Z6823 Body mass index (BMI) 23.0-23.9, adult: Secondary | ICD-10-CM | POA: Diagnosis not present

## 2019-07-01 DIAGNOSIS — Z01419 Encounter for gynecological examination (general) (routine) without abnormal findings: Secondary | ICD-10-CM | POA: Diagnosis not present

## 2019-07-11 IMAGING — MG 2D DIGITAL DIAGNOSTIC UNILATERAL LEFT MAMMOGRAM WITH CAD AND ADJ
8 of 12 series · 8 of 28 positions shown · non-contrast
Comparison: 05/09/2017 and earlier

CLINICAL DATA: Palpable abnormality in the left breast. Patient
first noted mass 1 week ago, and mass has gotten smaller since first
found.

EXAM:
2D DIGITAL DIAGNOSTIC LEFT MAMMOGRAM WITH CAD AND ADJUNCT TOMO
ULTRASOUND LEFT BREAST

[L XCCL synth-2D]
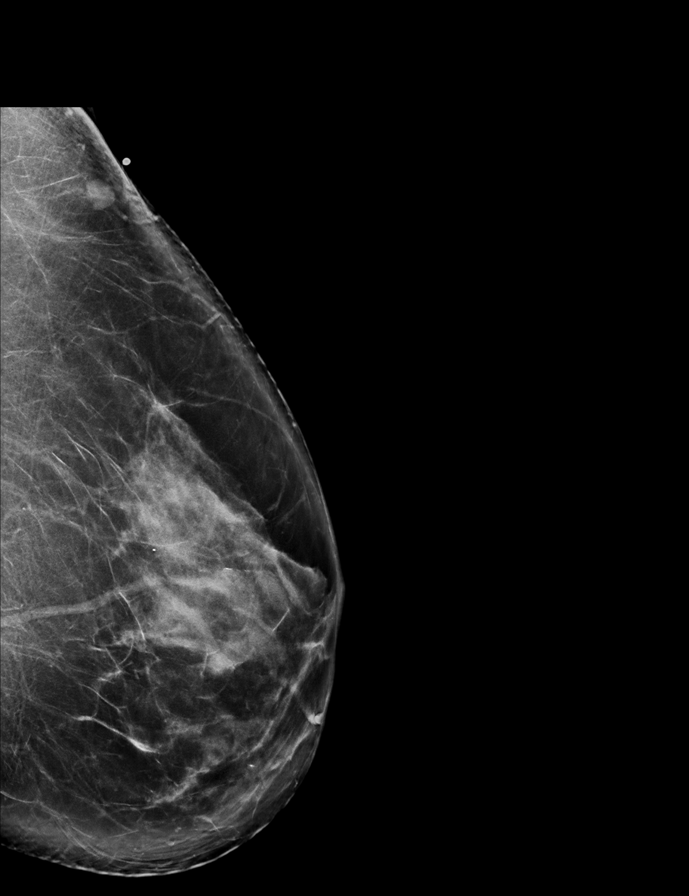

[L CC]
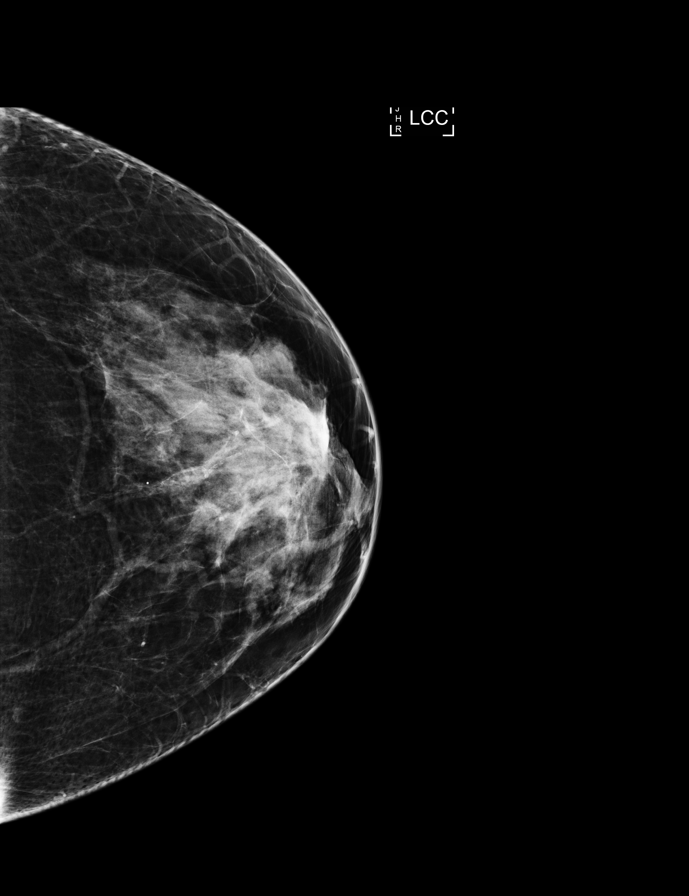

[L CC synth-2D]
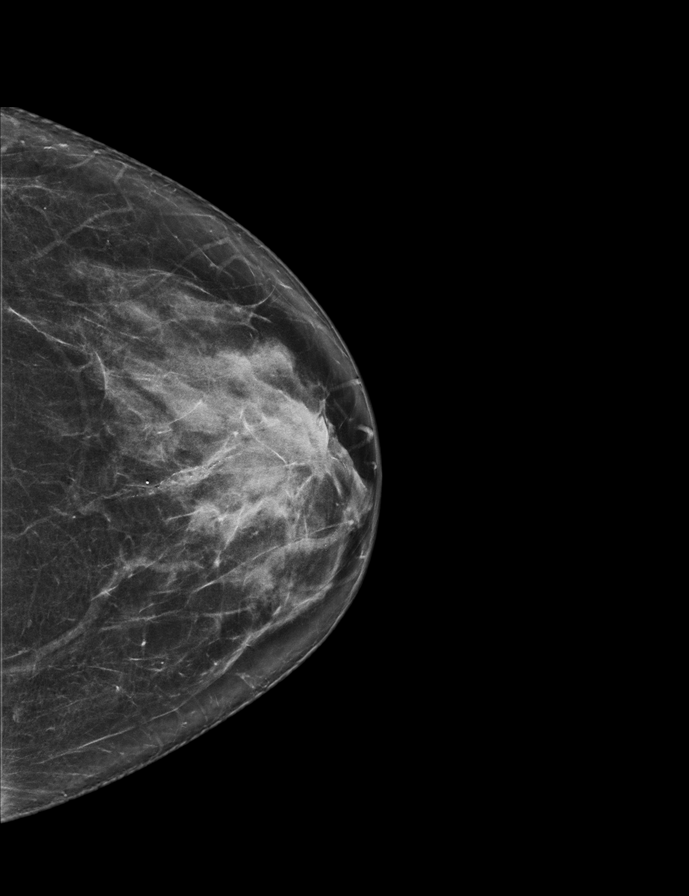

[L TAN synth-2D]
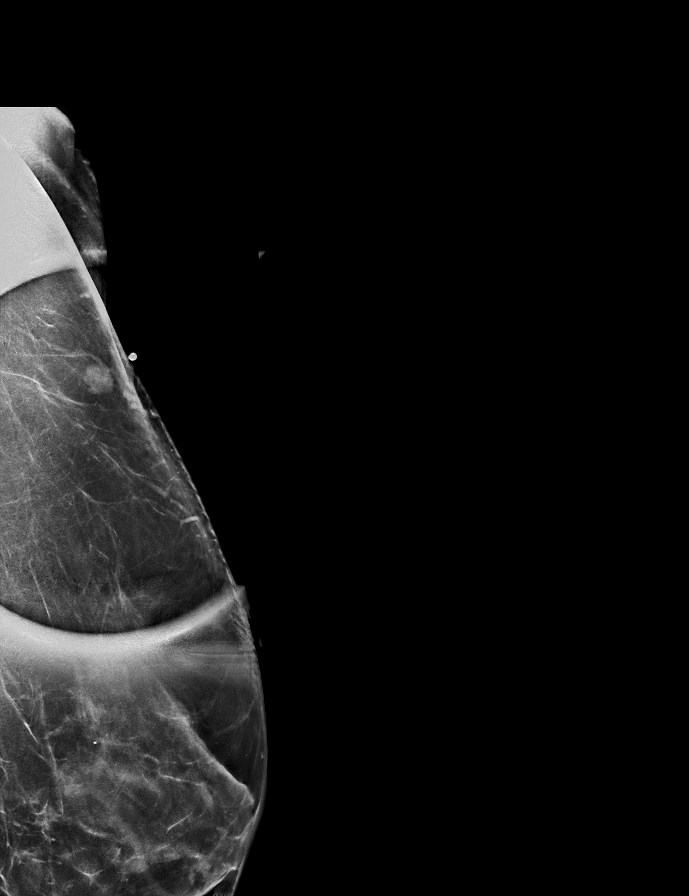

[L TAN]
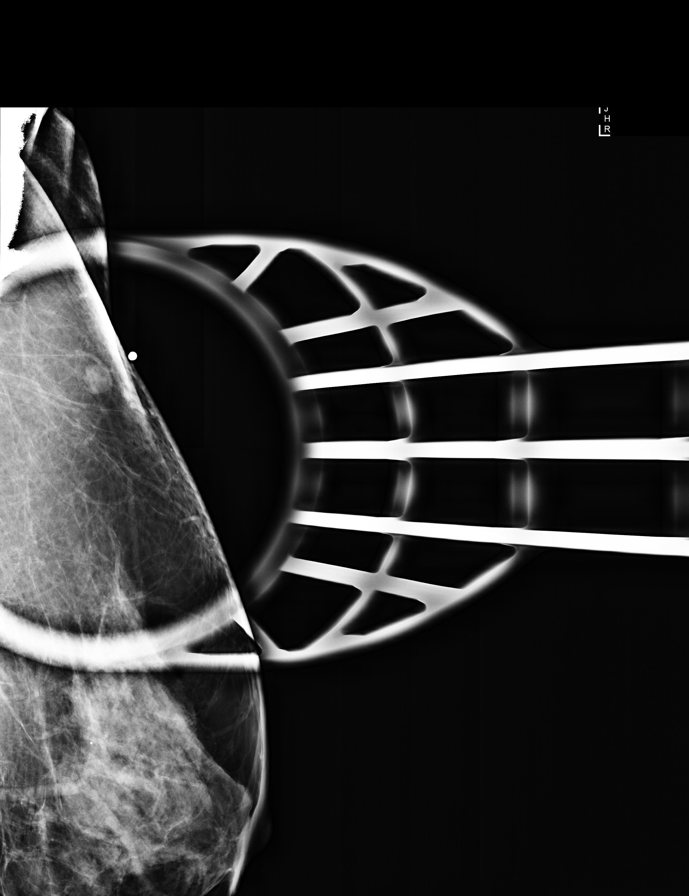

[L MLO synth-2D]
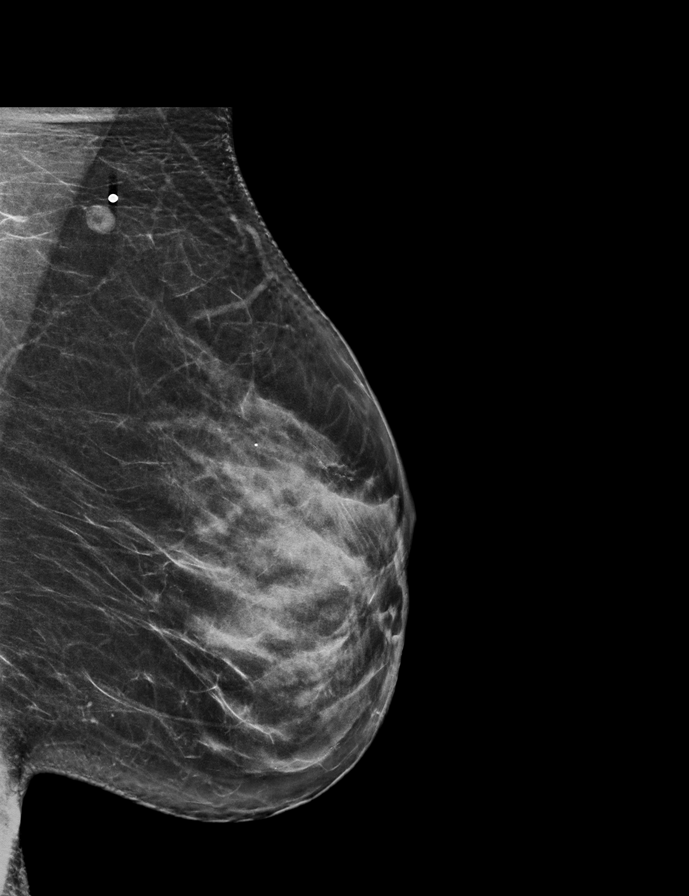

[L XCCL]
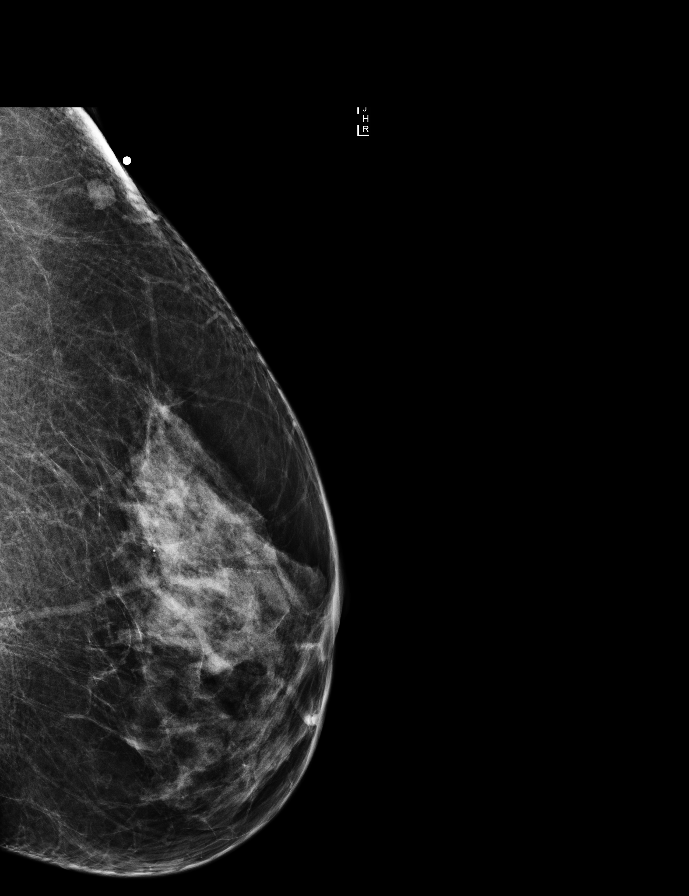

[L MLO]
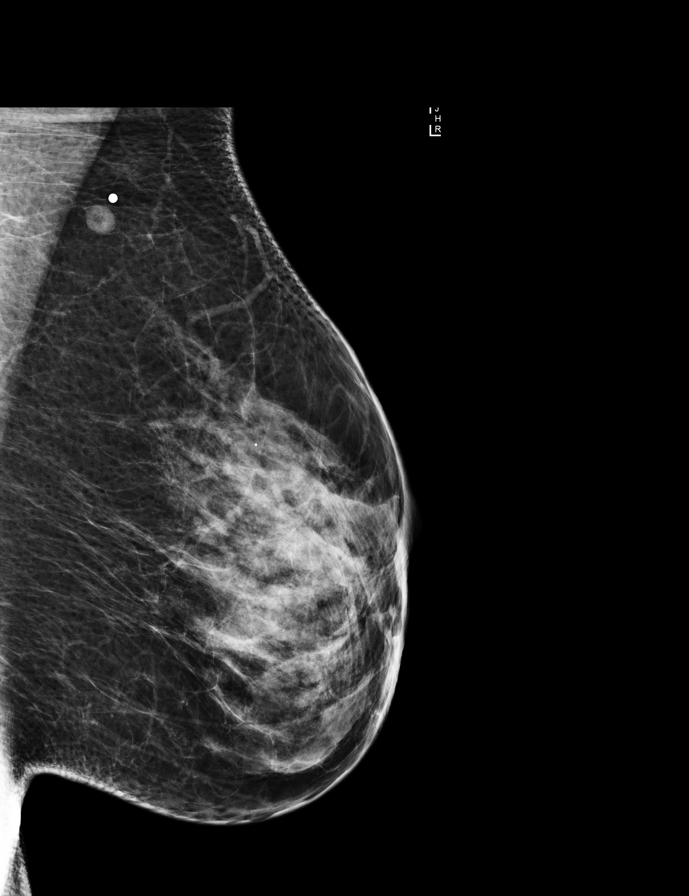

[8 of 28 positions shown; findings below may reference images not displayed]

ACR Breast Density Category c: The breast tissue is heterogeneously
dense, which may obscure small masses.
FINDINGS: Within the upper-outer quadrant of the left breast, there is a
circumscribed round mass with lucent center, marked as palpable with
a BB. Mass measures 8 mm. Mammographic appearance is compatible with
intramammary lymph node, slightly more dense compared to prior
studies. No suspicious distortion or microcalcifications are
identified in the breast.

Mammographic images were processed with CAD.

On physical exam, I palpate no discrete mass in the upper-outer
quadrant of the left breast.

Targeted ultrasound is performed, showing a circumscribed hypoechoic
mass with hyperechoic center in the 1 o'clock location left breast 8
cm from the nipple which measures 0.6 x 0.5 x 0.6 cm. The cortex
measures 2.5 mm. Internal blood flow is noted. Findings are
consistent benign intramammary lymph node. Evaluation of the left
axilla is negative for adenopathy.
IMPRESSION: 1. Palpable abnormality corresponds to an intramammary lymph node
which is slightly more prominent compared to prior studies.
2. Patient reports that mass has gotten significantly smaller and
less tender since first noted approximately 1 week ago.
3. We discussed the option of close follow-up versus biopsy.
Followup ultrasound is recommended and elected by the patient.

RECOMMENDATION:
Left breast ultrasound is recommended in 3 months to assess for
interval change. Consider biopsy if node is unchanged or larger.

I have discussed the findings and recommendations with the patient.
Results were also provided in writing at the conclusion of the
visit. If applicable, a reminder letter will be sent to the patient
regarding the next appointment.

BI-RADS CATEGORY  3: Probably benign.

## 2019-07-13 DIAGNOSIS — Z1231 Encounter for screening mammogram for malignant neoplasm of breast: Secondary | ICD-10-CM | POA: Diagnosis not present

## 2019-09-03 DIAGNOSIS — A601 Herpesviral infection of perianal skin and rectum: Secondary | ICD-10-CM | POA: Diagnosis not present

## 2019-09-03 DIAGNOSIS — Z13220 Encounter for screening for lipoid disorders: Secondary | ICD-10-CM | POA: Diagnosis not present

## 2019-09-03 DIAGNOSIS — E559 Vitamin D deficiency, unspecified: Secondary | ICD-10-CM | POA: Diagnosis not present

## 2019-09-03 DIAGNOSIS — Z23 Encounter for immunization: Secondary | ICD-10-CM | POA: Diagnosis not present

## 2019-09-03 DIAGNOSIS — R399 Unspecified symptoms and signs involving the genitourinary system: Secondary | ICD-10-CM | POA: Diagnosis not present

## 2019-09-03 DIAGNOSIS — Z0000 Encounter for general adult medical examination without abnormal findings: Secondary | ICD-10-CM | POA: Diagnosis not present

## 2019-09-03 DIAGNOSIS — N3090 Cystitis, unspecified without hematuria: Secondary | ICD-10-CM | POA: Diagnosis not present

## 2019-10-09 IMAGING — US ULTRASOUND LEFT BREAST LIMITED
1 series · 5 of 5 positions shown · non-contrast
Comparison: 08/29/2017

CLINICAL DATA: Followup for a cortically thickened left breast
intramammary lymph node. This was initially evaluated on 08/29/2017.
the lymph node.

EXAM:
ULTRASOUND OF THE LEFT BREAST

[Series 1: ultrasound left breast limited · 0.05mm/px · 5 of 5 slices shown]
[im 1/5]
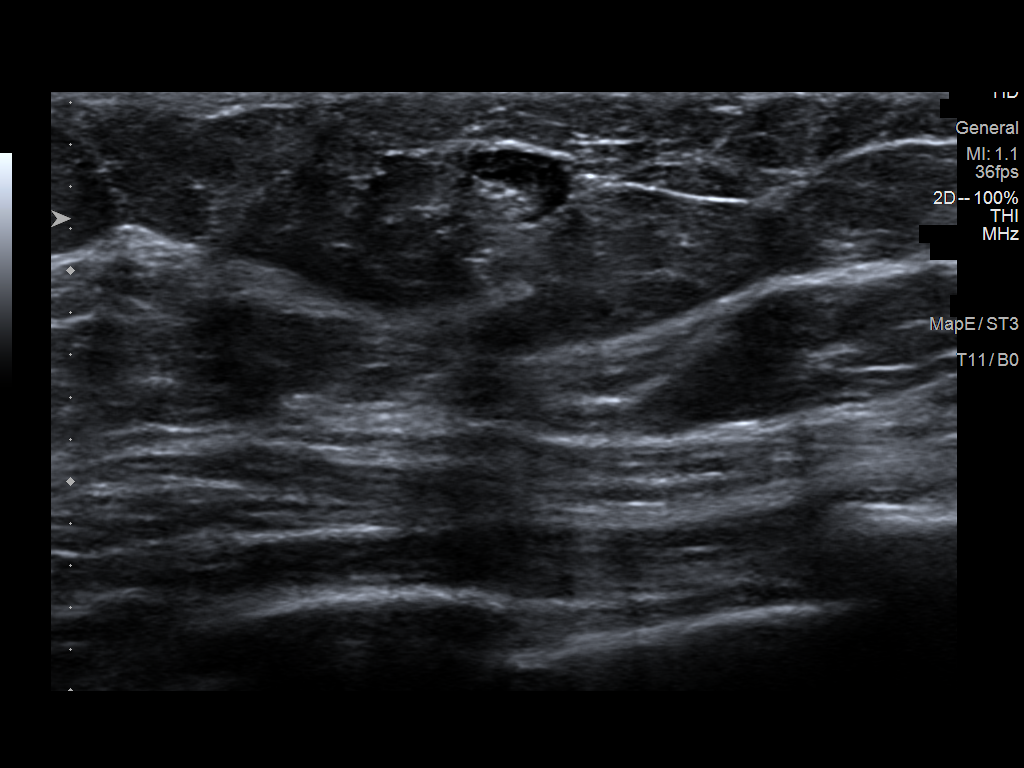
[im 2/5]
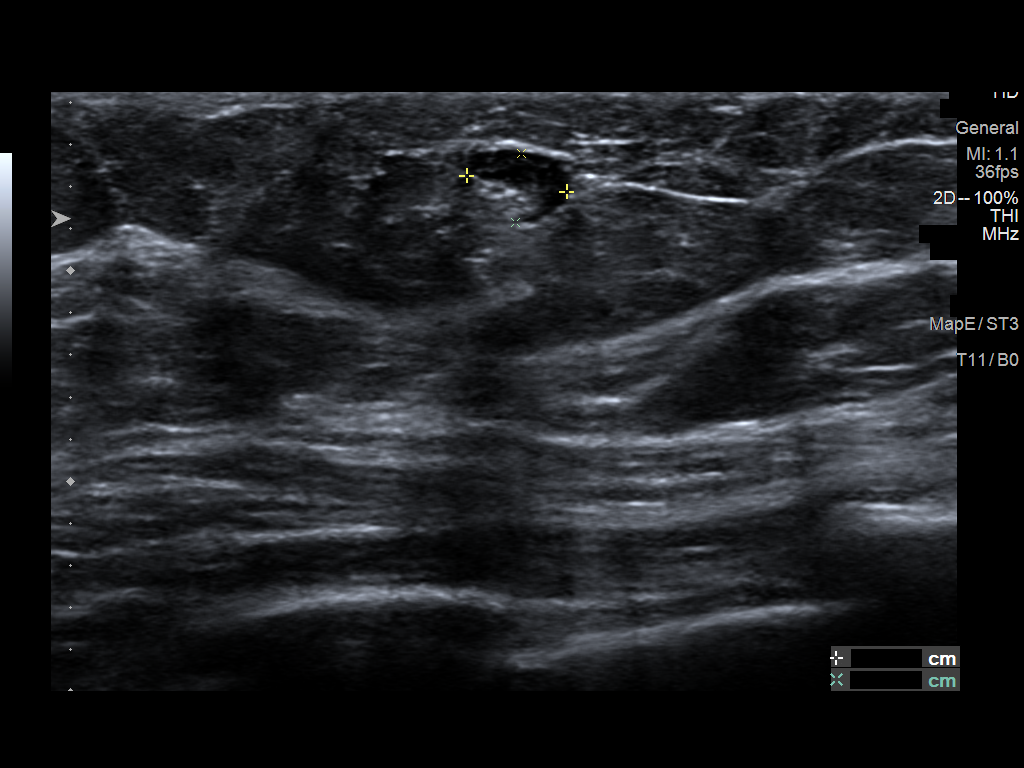
[im 3/5]
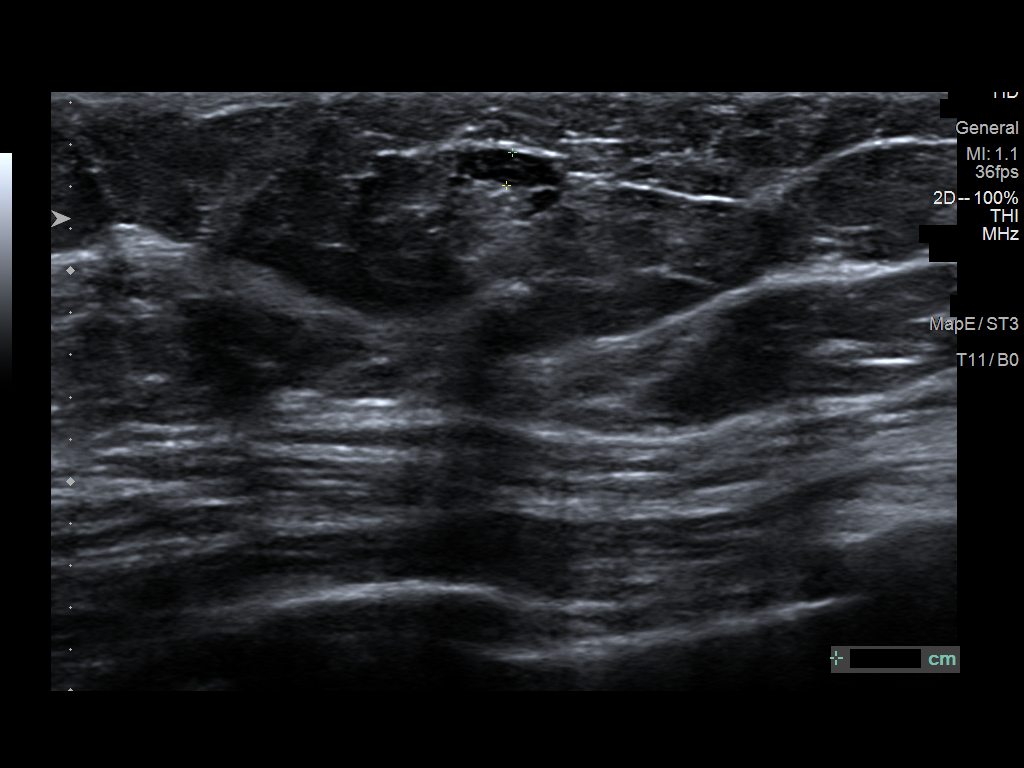
[im 4/5]
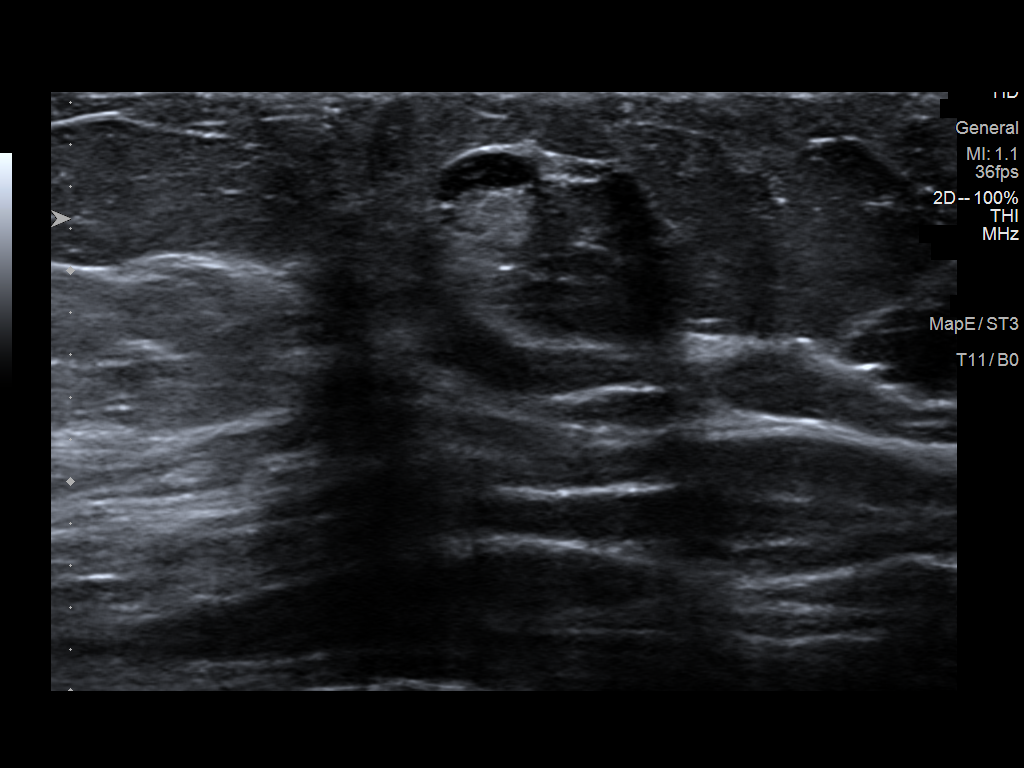
[im 5/5]
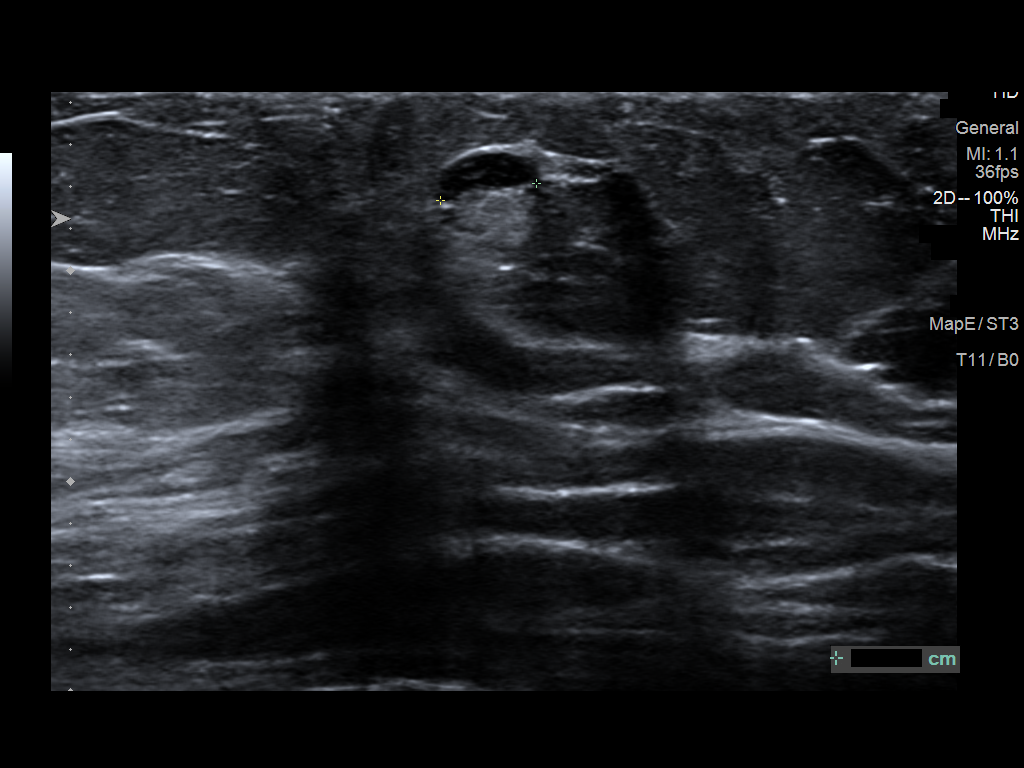

[5 of 5 positions shown; findings below may reference images not displayed]

FINDINGS: Targeted ultrasound is performed, showing a normal size intramammary
lymph node at the 1 o'clock position, 8 cm the nipple, measuring 5 x
3 x 5 mm with a cortical thickness under 2 mm, decreased in overall
size decreased in cortical thickness when compared to the prior
study. The node has normal morphology.
IMPRESSION: 1. Benign reactive left intramammary lymph node decreasing in size
and cortical thickness when compared to the prior study.

RECOMMENDATION:
Return to routine annual screening mammography with bilateral
mammography due in May 2018.

I have discussed the findings and recommendations with the patient.
Results were also provided in writing at the conclusion of the
visit. If applicable, a reminder letter will be sent to the patient
regarding the next appointment.

BI-RADS CATEGORY  2: Benign.

## 2019-11-03 DIAGNOSIS — Z01818 Encounter for other preprocedural examination: Secondary | ICD-10-CM | POA: Diagnosis not present

## 2019-11-16 DIAGNOSIS — Z1159 Encounter for screening for other viral diseases: Secondary | ICD-10-CM | POA: Diagnosis not present

## 2019-12-02 DIAGNOSIS — Z23 Encounter for immunization: Secondary | ICD-10-CM | POA: Diagnosis not present

## 2019-12-18 DIAGNOSIS — Z1159 Encounter for screening for other viral diseases: Secondary | ICD-10-CM | POA: Diagnosis not present

## 2019-12-23 DIAGNOSIS — Z1211 Encounter for screening for malignant neoplasm of colon: Secondary | ICD-10-CM | POA: Diagnosis not present

## 2019-12-23 DIAGNOSIS — D125 Benign neoplasm of sigmoid colon: Secondary | ICD-10-CM | POA: Diagnosis not present

## 2020-01-29 DIAGNOSIS — Z20828 Contact with and (suspected) exposure to other viral communicable diseases: Secondary | ICD-10-CM | POA: Diagnosis not present

## 2020-01-29 DIAGNOSIS — Z03818 Encounter for observation for suspected exposure to other biological agents ruled out: Secondary | ICD-10-CM | POA: Diagnosis not present

## 2020-06-08 DIAGNOSIS — Z20822 Contact with and (suspected) exposure to covid-19: Secondary | ICD-10-CM | POA: Diagnosis not present

## 2020-07-13 DIAGNOSIS — Z01419 Encounter for gynecological examination (general) (routine) without abnormal findings: Secondary | ICD-10-CM | POA: Diagnosis not present

## 2020-07-13 DIAGNOSIS — Z13820 Encounter for screening for osteoporosis: Secondary | ICD-10-CM | POA: Diagnosis not present

## 2020-07-13 DIAGNOSIS — Z6823 Body mass index (BMI) 23.0-23.9, adult: Secondary | ICD-10-CM | POA: Diagnosis not present

## 2020-09-01 DIAGNOSIS — Z1159 Encounter for screening for other viral diseases: Secondary | ICD-10-CM | POA: Diagnosis not present

## 2020-09-01 DIAGNOSIS — E559 Vitamin D deficiency, unspecified: Secondary | ICD-10-CM | POA: Diagnosis not present

## 2020-09-01 DIAGNOSIS — Z0000 Encounter for general adult medical examination without abnormal findings: Secondary | ICD-10-CM | POA: Diagnosis not present

## 2020-09-01 DIAGNOSIS — Z13220 Encounter for screening for lipoid disorders: Secondary | ICD-10-CM | POA: Diagnosis not present

## 2020-10-21 DIAGNOSIS — R059 Cough, unspecified: Secondary | ICD-10-CM | POA: Diagnosis not present

## 2020-10-21 DIAGNOSIS — J069 Acute upper respiratory infection, unspecified: Secondary | ICD-10-CM | POA: Diagnosis not present

## 2021-07-17 DIAGNOSIS — Z6824 Body mass index (BMI) 24.0-24.9, adult: Secondary | ICD-10-CM | POA: Diagnosis not present

## 2021-07-17 DIAGNOSIS — Z01419 Encounter for gynecological examination (general) (routine) without abnormal findings: Secondary | ICD-10-CM | POA: Diagnosis not present

## 2021-07-17 DIAGNOSIS — Z1231 Encounter for screening mammogram for malignant neoplasm of breast: Secondary | ICD-10-CM | POA: Diagnosis not present

## 2021-09-08 DIAGNOSIS — Z13220 Encounter for screening for lipoid disorders: Secondary | ICD-10-CM | POA: Diagnosis not present

## 2021-09-08 DIAGNOSIS — Z0000 Encounter for general adult medical examination without abnormal findings: Secondary | ICD-10-CM | POA: Diagnosis not present

## 2021-09-08 DIAGNOSIS — E559 Vitamin D deficiency, unspecified: Secondary | ICD-10-CM | POA: Diagnosis not present

## 2022-07-19 DIAGNOSIS — Z6822 Body mass index (BMI) 22.0-22.9, adult: Secondary | ICD-10-CM | POA: Diagnosis not present

## 2022-07-19 DIAGNOSIS — Z01419 Encounter for gynecological examination (general) (routine) without abnormal findings: Secondary | ICD-10-CM | POA: Diagnosis not present

## 2022-07-19 DIAGNOSIS — Z1231 Encounter for screening mammogram for malignant neoplasm of breast: Secondary | ICD-10-CM | POA: Diagnosis not present

## 2022-10-09 DIAGNOSIS — Z0000 Encounter for general adult medical examination without abnormal findings: Secondary | ICD-10-CM | POA: Diagnosis not present

## 2022-10-09 DIAGNOSIS — E559 Vitamin D deficiency, unspecified: Secondary | ICD-10-CM | POA: Diagnosis not present

## 2022-10-09 DIAGNOSIS — Z13220 Encounter for screening for lipoid disorders: Secondary | ICD-10-CM | POA: Diagnosis not present

## 2023-07-25 DIAGNOSIS — Z1231 Encounter for screening mammogram for malignant neoplasm of breast: Secondary | ICD-10-CM | POA: Diagnosis not present

## 2023-07-25 DIAGNOSIS — Z01419 Encounter for gynecological examination (general) (routine) without abnormal findings: Secondary | ICD-10-CM | POA: Diagnosis not present

## 2023-07-25 DIAGNOSIS — Z6822 Body mass index (BMI) 22.0-22.9, adult: Secondary | ICD-10-CM | POA: Diagnosis not present

## 2023-10-24 DIAGNOSIS — E559 Vitamin D deficiency, unspecified: Secondary | ICD-10-CM | POA: Diagnosis not present

## 2023-10-24 DIAGNOSIS — Z0000 Encounter for general adult medical examination without abnormal findings: Secondary | ICD-10-CM | POA: Diagnosis not present

## 2023-10-24 DIAGNOSIS — Z13220 Encounter for screening for lipoid disorders: Secondary | ICD-10-CM | POA: Diagnosis not present

## 2023-10-29 DIAGNOSIS — Z13820 Encounter for screening for osteoporosis: Secondary | ICD-10-CM | POA: Diagnosis not present

## 2024-08-06 DIAGNOSIS — Z1231 Encounter for screening mammogram for malignant neoplasm of breast: Secondary | ICD-10-CM | POA: Diagnosis not present

## 2024-08-06 DIAGNOSIS — Z6824 Body mass index (BMI) 24.0-24.9, adult: Secondary | ICD-10-CM | POA: Diagnosis not present

## 2024-08-06 DIAGNOSIS — Z01419 Encounter for gynecological examination (general) (routine) without abnormal findings: Secondary | ICD-10-CM | POA: Diagnosis not present

## 2024-08-06 DIAGNOSIS — Z1272 Encounter for screening for malignant neoplasm of vagina: Secondary | ICD-10-CM | POA: Diagnosis not present

## 2024-08-06 DIAGNOSIS — Z1151 Encounter for screening for human papillomavirus (HPV): Secondary | ICD-10-CM | POA: Diagnosis not present
